# Patient Record
Sex: Female | Born: 1965 | Race: White | Hispanic: No | Marital: Married | Smoking: Never smoker
Health system: Southern US, Community
[De-identification: ages and names within clinical notes are randomized; demographics above are authoritative.]

## PROBLEM LIST (undated history)

## (undated) DIAGNOSIS — J189 Pneumonia, unspecified organism: Secondary | ICD-10-CM

## (undated) DIAGNOSIS — E785 Hyperlipidemia, unspecified: Secondary | ICD-10-CM

## (undated) DIAGNOSIS — D801 Nonfamilial hypogammaglobulinemia: Secondary | ICD-10-CM

## (undated) DIAGNOSIS — C50919 Malignant neoplasm of unspecified site of unspecified female breast: Secondary | ICD-10-CM

## (undated) DIAGNOSIS — T7840XA Allergy, unspecified, initial encounter: Secondary | ICD-10-CM

## (undated) DIAGNOSIS — Z9221 Personal history of antineoplastic chemotherapy: Secondary | ICD-10-CM

## (undated) DIAGNOSIS — E282 Polycystic ovarian syndrome: Secondary | ICD-10-CM

## (undated) HISTORY — PX: PARTIAL HYSTERECTOMY: SHX80

## (undated) HISTORY — PX: TONSILLECTOMY: SUR1361

## (undated) HISTORY — PX: LASER ABLATION CONDYLOMA CERVICAL / VULVAR: SUR819

## (undated) HISTORY — DX: Malignant neoplasm of unspecified site of unspecified female breast: C50.919

## (undated) HISTORY — DX: Nonfamilial hypogammaglobulinemia: D80.1

## (undated) HISTORY — DX: Allergy, unspecified, initial encounter: T78.40XA

## (undated) HISTORY — PX: PORTACATH PLACEMENT: SHX2246

## (undated) HISTORY — DX: Polycystic ovarian syndrome: E28.2

## (undated) HISTORY — PX: OTHER SURGICAL HISTORY: SHX169

## (undated) HISTORY — DX: Personal history of antineoplastic chemotherapy: Z92.21

## (undated) HISTORY — PX: COLONOSCOPY: SHX174

## (undated) HISTORY — DX: Hyperlipidemia, unspecified: E78.5

## (undated) HISTORY — DX: Pneumonia, unspecified organism: J18.9

## (undated) HISTORY — PX: BREAST RECONSTRUCTION: SHX9

## (undated) HISTORY — PX: BREAST LUMPECTOMY: SHX2

---

## 2005-07-29 HISTORY — PX: MASTECTOMY: SHX3

## 2005-09-23 ENCOUNTER — Encounter: Admission: RE | Admit: 2005-09-23 | Discharge: 2005-09-23 | Payer: Self-pay | Admitting: Surgery

## 2010-08-19 ENCOUNTER — Encounter: Payer: Self-pay | Admitting: Surgery

## 2011-12-02 ENCOUNTER — Other Ambulatory Visit: Payer: Self-pay | Admitting: Neurosurgery

## 2011-12-02 DIAGNOSIS — M25552 Pain in left hip: Secondary | ICD-10-CM

## 2011-12-04 ENCOUNTER — Ambulatory Visit
Admission: RE | Admit: 2011-12-04 | Discharge: 2011-12-04 | Disposition: A | Discharge status: Home / Self Care | Payer: Self-pay | Source: Ambulatory Visit | Attending: Neurosurgery | Admitting: Neurosurgery

## 2011-12-04 DIAGNOSIS — M25552 Pain in left hip: Secondary | ICD-10-CM

## 2011-12-06 ENCOUNTER — Other Ambulatory Visit: Payer: Self-pay

## 2012-09-04 ENCOUNTER — Ambulatory Visit (INDEPENDENT_AMBULATORY_CARE_PROVIDER_SITE_OTHER): Payer: BC Managed Care – PPO | Admitting: Internal Medicine

## 2012-09-04 ENCOUNTER — Ambulatory Visit (HOSPITAL_COMMUNITY)
Admission: RE | Admit: 2012-09-04 | Discharge: 2012-09-04 | Disposition: A | Discharge status: Home / Self Care | Payer: BC Managed Care – PPO | Source: Ambulatory Visit | Attending: Internal Medicine | Admitting: Internal Medicine

## 2012-09-04 ENCOUNTER — Encounter: Payer: Self-pay | Admitting: Internal Medicine

## 2012-09-04 VITALS — BP 110/60 | HR 76 | Ht 64.0 in | Wt 171.5 lb

## 2012-09-04 DIAGNOSIS — R1031 Right lower quadrant pain: Secondary | ICD-10-CM

## 2012-09-04 DIAGNOSIS — R10813 Right lower quadrant abdominal tenderness: Secondary | ICD-10-CM

## 2012-09-04 DIAGNOSIS — K5900 Constipation, unspecified: Secondary | ICD-10-CM

## 2012-09-04 MED ORDER — IBUPROFEN 800 MG PO TABS: 800.0000 mg | ORAL_TABLET | Freq: Four times a day (QID) | ORAL | Status: DC | PRN

## 2012-09-04 NOTE — Progress Notes (Signed)
Quick Note:  Message left re: results Will follow-up by phone 1-2 days ______

## 2012-09-04 NOTE — Patient Instructions (Addendum)
You have been scheduled for a CT scan of the abdomen and pelvis at  [facility] .  You are scheduled today at 5:30pm. You should arrive 15 minutes prior to your appointment time for registration. Please follow the written instructions below on the day of your exam:  WARNING: IF YOU ARE ALLERGIC TO IODINE/X-RAY DYE, PLEASE NOTIFY RADIOLOGY IMMEDIATELY AT [phone]! YOU WILL BE GIVEN A 13 HOUR PREMEDICATION PREP.  1) Do not eat or drink anything after _____ (4 hours prior to your test) 2) You have been given 2 bottles of oral contrast to drink. The solution may taste               better if refrigerated, but do NOT add ice or any other liquid to this solution. Shake             well before drinking.    Drink 1 bottle of contrast @ now (2 hours prior to your exam)  Drink 1 bottle of contrast @ hospital (1 hour prior to your exam)  You may take any medications as prescribed with a small amount of water except for the following: Metformin, Glucophage, Glucovance, Avandamet, Riomet, Fortamet, Actoplus Met, Janumet, Glumetza or Metaglip. The above medications must be held the day of the exam AND 48 hours after the exam.  The purpose of you drinking the oral contrast is to aid in the visualization of your intestinal tract. The contrast solution may cause some diarrhea. Before your exam is started, you will be given a small amount of fluid to drink. Depending on your individual set of symptoms, you may also receive an intravenous injection of x-ray contrast/dye. Plan on being at [facility] for 30 minutes or long, depending on the type of exam you are having performed.  This test typically takes 30-45 minutes to complete.  If you have any questions regarding your exam or if you need to reschedule, you may call the CT department at [phone] between the hours of 8:00 am and 5:00 pm, Monday-Friday.    Dr. Leone Payor is going to call you with results and plans.  We have sent the  following medications to your pharmacy for you to pick up at your convenience: Ibuprofen   Thank you for choosing me and [facility].  Iva Boop, M.D., [facility]  ________________________________________________________________________

## 2012-09-04 NOTE — Progress Notes (Signed)
Subjective:    Patient ID: Sandy Simmons, female    DOB: 06/29/1966, 47 y.o.   MRN: [id number]  HPI This very pleasant woman presents with several days of constant and increasing RLQ abdominal pain and tenderness. She has a history of chronic constipation but had been moving her bowels. She developed a steady RLQ pain and flank/back radiation about 3-4 days ago. It was not affected by defecation or eating or movement. No fever. Hgb and WBC normal at urgent care yesterday. KUB 1 view shows a large amount of stool but no other problems. Images viewed. UA was also negative also. (Lab results per patient). She started cipro and metronidazole yesterday. No fever. She took Aleve x 1 without help. She is not sleeping well due to pain. She is unable to take typical analgesics due to allergies/anaphylaxis. She had a colonoscopy in [place] 2009 - says no problems. Had pain after chemoTx for breast cancer. ?'s if oopherectomy and scar tissue are related to pain. Uterus remains.  Allergies  Allergen Reactions  . Codeine   . Demerol (Meperidine)   . Dilaudid (Hydromorphone Hcl)   . Hydrocodone   . Iohexol     Anaphylaxis even with prednisone prep  . Peanuts (Peanut Oil)   . Percocet (Oxycodone-Acetaminophen)   . Eggs Or Egg-Derived Products     Past Medical History  Diagnosis Date  . Breast cancer     lymph nodes  . HLD (hyperlipidemia)   . Pneumonia   . Polycystic ovaries   . Hypogammaglobulinemia, acquired    Past Surgical History  Procedure Date  . Partial hysterectomy     ovaries and tubes removed  . Tonsillectomy   . Laser ablation condyloma cervical / vulvar   . Portacath placement   . Portacath removal   . Mastectomy     bilateral  . Breast reconstruction   . Breast lumpectomy     lymph nodes removed  . Colonoscopy    History   Social History  . Marital Status: Married    Spouse Name: N/A    Number of Children: 1  . Years of Education: N/A   Occupational History  .  preschool teacher    Social History Main Topics  . Smoking status: Never Smoker   . Smokeless tobacco: Never Used  . Alcohol Use: No  . Drug Use: No    Social History Narrative   Married, 1 daughter - Administrator   Family History  Problem Relation Age of Onset  . Colon cancer Maternal Grandmother   . Colon cancer Maternal Uncle   . Colon cancer Maternal Aunt   . Colon polyps Mother   . Diabetes Father   . Heart disease Father   . Heart disease Other     both sides of family        Review of Systems     Objective:   Physical Exam General:  Well-developed, well-nourished and in no acute distress Eyes:  anicteric. ENT:   Mouth and posterior pharynx free of lesions.  Neck:   supple w/o thyromegaly or mass.  Lungs: Clear to auscultation bilaterally. Heart:  S1S2, no rubs, murmurs, gallops. Abdomen:  soft, tender in RLQ - moderate without guarding or rebound, no hepatosplenomegaly, hernia, or mass and BS+.  Lymph:  no cervical or supraclavicular adenopathy. Extremities:   no edema Skin   no rash. Neuro:  A&O x 3.  Psych:  appropriate mood and  Affec  Assessment & Plan:   1. RLQ abdominal pain  ,CT Abdomen Pelvis Wo Contrast  2. RLQ abdominal tenderness  CT Abdomen Pelvis Wo Contrast  3. Constipation     1. CT shows no acute abnormality 2. Diverticulitis still possible and seems like most likely dx but not clear 3. Stay on cipro/metronidazole 4. Reassess by phone - may need more aggressive laxatives, pelvic US/MR depending upon course  Cc:Dr. Claude Manges

## 2012-09-07 NOTE — Progress Notes (Signed)
Quick Note:  I ended up speaking to her and advised MiraLax purge if not better on 2/8 She was to call us and let us know how she was To complete Abx also ______

## 2012-09-12 ENCOUNTER — Other Ambulatory Visit: Payer: Self-pay

## 2012-09-16 ENCOUNTER — Telehealth: Payer: Self-pay | Admitting: Internal Medicine

## 2012-09-16 NOTE — Telephone Encounter (Signed)
MiraLax 1 -2 doses a day to help constipation  If that is not effective or has failed in past would use Linzess 145 ug daily and could sample or Rx for 1 month 1 refill

## 2012-09-16 NOTE — Telephone Encounter (Signed)
Patient doing better, but still having pain on the right side and constipation.  Please advise

## 2012-09-16 NOTE — Telephone Encounter (Signed)
Left message for patient to call back  

## 2012-09-18 NOTE — Telephone Encounter (Signed)
Left message for patient to call back  

## 2012-09-18 NOTE — Telephone Encounter (Signed)
I will send the patient a message via my chart.   No return calls from the patient prior to the weekend

## 2012-11-17 ENCOUNTER — Telehealth: Payer: Self-pay | Admitting: Internal Medicine

## 2012-11-17 NOTE — Telephone Encounter (Signed)
Jill Alexanders called and the patient needs a colon for a family history of colon cancer and "routine screening".  They did not know who the family member was.  I have left a message for the patient to call back to discuss

## 2012-11-17 NOTE — Telephone Encounter (Signed)
Patient was recently seen by Dr. Leone Payor and no recall was indicated at that time.  I did speak with Jill Alexanders from Dr. Claude Manges, he is not sure what the reason for the colon is.  He will find out and call me back.

## 2012-11-17 NOTE — Telephone Encounter (Signed)
Maternal grandmother, aunt and uncle all had colon cancer.  Her oncologist would like her to have a screening colon now.  She reports she can't do until this summer.  Dr. Leone Payor is it ok to schedule?

## 2012-11-17 NOTE — Telephone Encounter (Signed)
Left message for patient to call back  

## 2012-11-19 NOTE — Telephone Encounter (Signed)
OK to schedule a colonoscopy re: family hx this summer

## 2012-11-19 NOTE — Telephone Encounter (Signed)
I have notified Sandy Simmons at [facility] and left a message for the patient to call back when she is ready to schedule the colon this summer when she is out of school.

## 2013-01-08 ENCOUNTER — Telehealth: Payer: Self-pay | Admitting: Internal Medicine

## 2013-01-08 ENCOUNTER — Encounter: Payer: Self-pay | Admitting: Internal Medicine

## 2013-01-08 NOTE — Telephone Encounter (Signed)
yes

## 2013-01-08 NOTE — Telephone Encounter (Signed)
Dr. Leone Payor she would like to use osmoprep for her procedure is this ok (Dixie's sis-in-law)?

## 2013-01-08 NOTE — Telephone Encounter (Signed)
Left message for patient to call back  

## 2013-01-11 NOTE — Telephone Encounter (Signed)
Patient notified ok for Osmoprep

## 2013-01-20 ENCOUNTER — Encounter: Payer: Self-pay | Admitting: Internal Medicine

## 2013-01-20 ENCOUNTER — Ambulatory Visit (AMBULATORY_SURGERY_CENTER): Payer: BC Managed Care – PPO

## 2013-01-20 VITALS — Ht 65.0 in | Wt 172.6 lb

## 2013-01-20 DIAGNOSIS — Z800 Family history of malignant neoplasm of digestive organs: Secondary | ICD-10-CM

## 2013-01-20 DIAGNOSIS — Z1211 Encounter for screening for malignant neoplasm of colon: Secondary | ICD-10-CM

## 2013-01-20 DIAGNOSIS — R109 Unspecified abdominal pain: Secondary | ICD-10-CM

## 2013-01-20 NOTE — Progress Notes (Signed)
Osmoprep called into [facility] in [place], [place] [phone]).OK per Dr Leone Payor.

## 2013-01-26 ENCOUNTER — Ambulatory Visit (AMBULATORY_SURGERY_CENTER): Payer: BC Managed Care – PPO | Admitting: Internal Medicine

## 2013-01-26 ENCOUNTER — Encounter: Payer: Self-pay | Admitting: Internal Medicine

## 2013-01-26 VITALS — BP 127/76 | HR 82 | Temp 97.7°F | Resp 18 | Ht 65.0 in | Wt 172.0 lb

## 2013-01-26 DIAGNOSIS — Z800 Family history of malignant neoplasm of digestive organs: Secondary | ICD-10-CM

## 2013-01-26 DIAGNOSIS — Z1211 Encounter for screening for malignant neoplasm of colon: Secondary | ICD-10-CM

## 2013-01-26 HISTORY — PX: COLONOSCOPY: SHX174

## 2013-01-26 MED ORDER — SODIUM CHLORIDE 0.9 % IV SOLN: 500.0000 mL | INTRAVENOUS | Status: DC

## 2013-01-26 MED ORDER — LINACLOTIDE 290 MCG PO CAPS: 1.0000 | ORAL_CAPSULE | Freq: Every day | ORAL | Status: DC

## 2013-01-26 NOTE — Progress Notes (Signed)
Lidocaine-40mg IV prior to Propofol InductionPropofol given over incremental dosages 

## 2013-01-26 NOTE — Op Note (Signed)
[facility] [address] [place], [address]   COLONOSCOPY PROCEDURE REPORT  PATIENT: Sandy, Simmons  MR#: [id number] BIRTHDATE: 19-May-1966 , 46  yrs. old GENDER: Female ENDOSCOPIST: Iva Boop, MD, [facility] PROCEDURE DATE:  01/26/2013 PROCEDURE:   Colonoscopy, screening ASA CLASS:   Class II INDICATIONS:Patient's family history of colon cancer, distant relatives.   Maternal grandmother, aunt and uncle MEDICATIONS: propofol (Diprivan) 200mg  IV, MAC sedation, administered by CRNA, and These medications were titrated to patient response per physician's verbal order  DESCRIPTION OF PROCEDURE:   After the risks benefits and alternatives of the procedure were thoroughly explained, informed consent was obtained.  A digital rectal exam revealed no abnormalities of the rectum.   The LB [id number]  endoscope was introduced through the anus and advanced to the terminal ileum which was intubated for a short distance. No adverse events experienced.   The quality of the prep was good, using Osmoprep The instrument was then slowly withdrawn as the colon was fully examined.      COLON FINDINGS: A normal appearing cecum, ileocecal valve, and appendiceal orifice were identified.  The ascending, hepatic flexure, transverse, splenic flexure, descending, sigmoid colon and rectum appeared unremarkable.  No polyps or cancers were seen.   A right colon retroflexion was performed. Terminal ileum normal. Retroflexed views revealed no abnormalities. The time to cecum=2 minutes 48 seconds.  Withdrawal time=10 minutes 12 seconds.  The scope was withdrawn and the procedure completed. COMPLICATIONS: There were no complications.  ENDOSCOPIC IMPRESSION: Normal colonoscopy and terminal ileoscopy, good prep  RECOMMENDATIONS: 1.  Try Linzess 290 mcg daily before breakfast for chronic constipation 2.  Repeat Colonoscopy in 5 years.   eSigned:  Iva Boop, MD, [facility]  01/26/2013 4:22 PM   cc: The Patient, Cathi Roan, MD [facility]

## 2013-01-26 NOTE — Patient Instructions (Addendum)
The colonoscopy was normal! Next routine colonoscopy in 5 years.  Try the Linzess for constipation. I am going to provide some samples and a prescription. Its on your med list. You may not need Miralax with the Linzess.    I appreciate the opportunity to care for you. Iva Boop, MD, FACG  YOU HAD AN ENDOSCOPIC PROCEDURE TODAY AT THE [facility]: Refer to the procedure report that was given to you for any specific questions about what was found during the examination.  If the procedure report does not answer your questions, please call your gastroenterologist to clarify.  If you requested that your care partner not be given the details of your procedure findings, then the procedure report has been included in a sealed envelope for you to review at your convenience later.  YOU SHOULD EXPECT: Some feelings of bloating in the abdomen. Passage of more gas than usual.  Walking can help get rid of the air that was put into your GI tract during the procedure and reduce the bloating. If you had a lower endoscopy (such as a colonoscopy or flexible sigmoidoscopy) you may notice spotting of blood in your stool or on the toilet paper. If you underwent a bowel prep for your procedure, then you may not have a normal bowel movement for a few days.  DIET: Your first meal following the procedure should be a light meal and then it is ok to progress to your normal diet.  A half-sandwich or bowl of soup is an example of a good first meal.  Heavy or fried foods are harder to digest and may make you feel nauseous or bloated.  Likewise meals heavy in dairy and vegetables can cause extra gas to form and this can also increase the bloating.  Drink plenty of fluids but you should avoid alcoholic beverages for 24 hours.  ACTIVITY: Your care partner should take you home directly after the procedure.  You should plan to take it easy, moving slowly for the rest of the day.  You can resume normal activity the  day after the procedure however you should NOT DRIVE or use heavy machinery for 24 hours (because of the sedation medicines used during the test).    SYMPTOMS TO REPORT IMMEDIATELY: A gastroenterologist can be reached at any hour.  During normal business hours, 8:30 AM to 5:00 PM Monday through Friday, call [phone].  After hours and on weekends, please call the GI answering service at [phone] emergency number  who will take a message and have the physician on call contact you.   Following lower endoscopy (colonoscopy or flexible sigmoidoscopy):  Excessive amounts of blood in the stool  Significant tenderness or worsening of abdominal pains  Swelling of the abdomen that is new, acute  Fever of 100F or higher  FOLLOW UP: If any biopsies were taken you will be contacted by phone or by letter within the next 1-3 weeks.  Call your gastroenterologist if you have not heard about the biopsies in 3 weeks.  Our staff will call the home number listed on your records the next business day following your procedure to check on you and address any questions or concerns that you may have at that time regarding the information given to you following your procedure. This is a courtesy call and so if there is no answer at the home number and we have not heard from you through the emergency physician on call, we will assume that you  have returned to your regular daily activities without incident.  SIGNATURES/CONFIDENTIALITY: You and/or your care partner have signed paperwork which will be entered into your electronic medical record.  These signatures attest to the fact that that the information above on your After Visit Summary has been reviewed and is understood.  Full responsibility of the confidentiality of this discharge information lies with you and/or your care-partner.  Repeat colonoscopy in 5 years

## 2013-01-26 NOTE — Progress Notes (Signed)
Pre op eval patient states allergy to egg yoke creates headaches.No problems with egg whites

## 2013-01-26 NOTE — Progress Notes (Signed)
Patient did not experience any of the following events: a burn prior to discharge; a fall within the facility; wrong site/side/patient/procedure/implant event; or a hospital transfer or hospital admission upon discharge from the facility. (G8907)Patient did not have preoperative order for IV antibiotic SSI prophylaxis. ([id number]) ewm 

## 2013-01-27 ENCOUNTER — Telehealth: Payer: Self-pay | Admitting: Internal Medicine

## 2013-01-27 ENCOUNTER — Telehealth: Payer: Self-pay | Admitting: *Deleted

## 2013-01-27 NOTE — Telephone Encounter (Signed)
  Follow up Call-  Call back number 01/26/2013  Post procedure Call Back phone  # [phone]  Permission to leave phone message Yes     Patient questions:  Do you have a fever, pain , or abdominal swelling? no Pain Score  0 *  Have you tolerated food without any problems? yes  Have you been able to return to your normal activities? yes  Do you have any questions about your discharge instructions: Diet   no Medications  no Follow up visit  no  Do you have questions or concerns about your Care? no  Actions: * If pain score is 4 or above: No action needed, pain <4.

## 2013-01-27 NOTE — Telephone Encounter (Signed)
Unfortunately, all of our copay cards for Linzess have expired as of yesterday. We have, however, contacted Sandy Simmons, the Linzess drug rep for additional copay cards. Patient advised that we will contact her to let her know when we receive updated cards and we will mail her one of them. She verbalizes understanding.

## 2013-01-28 ENCOUNTER — Telehealth: Payer: Self-pay

## 2013-01-28 NOTE — Telephone Encounter (Signed)
Spoke to patient regarding the Linzess drug card.  Our Reps have assured [place] that they are good despite the expiration date of 01/25/13.  That has been extended.  I am going to mail her out another card and I told her the Rep said the pharmacy has to run the card numbers to get it to go thru.  She will call back if any problems.

## 2013-01-29 ENCOUNTER — Encounter (HOSPITAL_COMMUNITY): Payer: Self-pay | Admitting: *Deleted

## 2013-01-29 ENCOUNTER — Emergency Department (HOSPITAL_COMMUNITY)
Admission: EM | Admit: 2013-01-29 | Discharge: 2013-01-29 | Disposition: A | Discharge status: Home / Self Care | Payer: BC Managed Care – PPO | Attending: Emergency Medicine | Admitting: Emergency Medicine

## 2013-01-29 DIAGNOSIS — Z8742 Personal history of other diseases of the female genital tract: Secondary | ICD-10-CM | POA: Insufficient documentation

## 2013-01-29 DIAGNOSIS — Z79899 Other long term (current) drug therapy: Secondary | ICD-10-CM | POA: Insufficient documentation

## 2013-01-29 DIAGNOSIS — R319 Hematuria, unspecified: Secondary | ICD-10-CM | POA: Insufficient documentation

## 2013-01-29 DIAGNOSIS — E785 Hyperlipidemia, unspecified: Secondary | ICD-10-CM | POA: Insufficient documentation

## 2013-01-29 DIAGNOSIS — R110 Nausea: Secondary | ICD-10-CM | POA: Insufficient documentation

## 2013-01-29 DIAGNOSIS — Z8701 Personal history of pneumonia (recurrent): Secondary | ICD-10-CM | POA: Insufficient documentation

## 2013-01-29 DIAGNOSIS — Z853 Personal history of malignant neoplasm of breast: Secondary | ICD-10-CM | POA: Insufficient documentation

## 2013-01-29 DIAGNOSIS — R197 Diarrhea, unspecified: Secondary | ICD-10-CM | POA: Insufficient documentation

## 2013-01-29 DIAGNOSIS — Z3202 Encounter for pregnancy test, result negative: Secondary | ICD-10-CM | POA: Insufficient documentation

## 2013-01-29 DIAGNOSIS — R109 Unspecified abdominal pain: Secondary | ICD-10-CM | POA: Insufficient documentation

## 2013-01-29 DIAGNOSIS — R350 Frequency of micturition: Secondary | ICD-10-CM | POA: Insufficient documentation

## 2013-01-29 LAB — CBC WITH DIFFERENTIAL/PLATELET
Hemoglobin: 13.4 g/dL (ref 12.0–15.0)
Lymphocytes Relative: 27 % (ref 12–46)
Lymphs Abs: 2.6 10*3/uL (ref 0.7–4.0)
MCH: 28.8 pg (ref 26.0–34.0)
MCHC: 33.6 g/dL (ref 30.0–36.0)
Neutro Abs: 6.1 10*3/uL (ref 1.7–7.7)
Neutrophils Relative %: 63 % (ref 43–77)
Platelets: 232 10*3/uL (ref 150–400)
RDW: 13 % (ref 11.5–15.5)

## 2013-01-29 LAB — BASIC METABOLIC PANEL
BUN: 12 mg/dL (ref 6–23)
CO2: 27 mEq/L (ref 19–32)
Chloride: 104 mEq/L (ref 96–112)
GFR calc non Af Amer: >90 mL/min (ref >90)
Glucose, Bld: 104 mg/dL — ABNORMAL HIGH (ref 70–99)
Potassium: 3.7 mEq/L (ref 3.5–5.1)

## 2013-01-29 LAB — URINALYSIS, ROUTINE W REFLEX MICROSCOPIC
Bilirubin Urine: NEGATIVE
Glucose, UA: NEGATIVE mg/dL
Hgb urine dipstick: NEGATIVE
Leukocytes, UA: NEGATIVE
Nitrite: NEGATIVE
Urobilinogen, UA: 0.2 mg/dL (ref 0.0–1.0)

## 2013-01-29 LAB — PREGNANCY, URINE: Preg Test, Ur: NEGATIVE

## 2013-01-29 MED ORDER — ONDANSETRON HCL 4 MG PO TABS: 4.0000 mg | ORAL_TABLET | Freq: Four times a day (QID) | ORAL | Status: DC

## 2013-01-29 MED ORDER — NAPROXEN 500 MG PO TABS
500.0000 mg | ORAL_TABLET | Freq: Once | ORAL | Status: AC
  Administered 2013-01-29: 500 mg via ORAL
  Filled 2013-01-29: qty 1

## 2013-01-29 MED ORDER — NAPROXEN 500 MG PO TABS: 500.0000 mg | ORAL_TABLET | Freq: Two times a day (BID) | ORAL | Status: AC

## 2013-01-29 NOTE — ED Provider Notes (Signed)
History    CSN: [id number] Arrival date & time 01/29/13  0157  First MD Initiated Contact with Patient 01/29/13 [phone]     Chief Complaint  Patient presents with  . Abdominal Pain  . Nausea  . Urinary Frequency   (Consider location/radiation/quality/duration/timing/severity/associated sxs/prior Treatment) The history is provided by the patient. No language interpreter was used.  Sandy Simmons is a 47 y/o F with PMHx of breast cancer with double mastectomy with reconstruction and chemotherapy completed in 2007, HLD, polycystic ovaries, partial hysterectomy, hypogammaglobulinemia presenting to the ED with abdominal pain that occurred last night that came on suddenly, lasting for approximately an hour and a half while patient was at the restaurant. Stated that abdominal was localized to the right lower quadrant, suprapubic region - radiation from the right flank region that was described as a pain that made her double-over. Patient stated that with this onset of pain she felt extremely nausea, broke out in a cold sweat. Stated that when she went to the bathroom she has hematuria and started to drink plenty of water. Stated that when she went to the bathroom again her urine was a pinkish color - stated that she urinated approximately 6 times yesterday, the first being hematuria and the remaining urine had mild hematuria to none. Stated that she has a burning sensation to the right flank. Denied chest pain, shortness of breath, difficulty breathing, numbness, tingling, fever, chills, vaginal bleeding, vaginal pain, vomiting, vaginal discharge. Reported that she has history of kidney stones when she was younger.   Oncologist: Dr. Hardie Pulley    Past Medical History  Diagnosis Date  . Breast cancer     lymph nodes /double mastectomy  . HLD (hyperlipidemia)   . Pneumonia   . Polycystic ovaries   . Hypogammaglobulinemia, acquired   . History of cancer chemotherapy     In 2007/  for 7 months    Past Surgical History  Procedure Laterality Date  . Partial hysterectomy      ovaries and tubes removed  . Tonsillectomy    . Laser ablation condyloma cervical / vulvar    . Portacath placement    . Portacath removal    . Mastectomy  2007    bilateral  . Breast reconstruction  2009-2010  . Breast lumpectomy      lymph nodes removed  . Colonoscopy     Family History  Problem Relation Age of Onset  . Colon cancer Maternal Grandmother   . Colon cancer Maternal Uncle   . Colon cancer Maternal Aunt   . Colon polyps Mother   . Diabetes Father   . Heart disease Father   . Heart disease Other     both sides of family   History  Substance Use Topics  . Smoking status: Never Smoker   . Smokeless tobacco: Never Used  . Alcohol Use: No   OB History   Grav Para Term Preterm Abortions TAB SAB Ect Mult Living                 Review of Systems  Constitutional: Negative for fever and chills.  HENT: Negative for neck pain.   Respiratory: Negative for chest tightness and shortness of breath.   Cardiovascular: Negative for chest pain.  Gastrointestinal: Positive for nausea, abdominal pain and diarrhea. Negative for vomiting, constipation, blood in stool and anal bleeding.  Genitourinary: Positive for hematuria and flank pain (right sided). Negative for vaginal bleeding, vaginal discharge, difficulty urinating and vaginal  pain.  Neurological: Negative for dizziness, weakness and headaches.  All other systems reviewed and are negative.    Allergies  Codeine; Darvocet; Demerol; Dilaudid; Hydrocodone; Iohexol; Lortab; Morphine and related; Peanuts; Percocet; Aspartame and phenylalanine; Eggs or egg-derived products; Erythromycin; and Penicillins  Home Medications   Current Outpatient Rx  Name  Route  Sig  Dispense  Refill  . calcium carbonate (OS-CAL) 600 MG TABS   Oral   Take 1,200 mg by mouth daily with breakfast.          . ibuprofen (ADVIL,MOTRIN) 200 MG tablet   Oral    Take 800 mg by mouth every 6 (six) hours as needed for pain.         Marland Kitchen FeFum-FePoly-FA-B Cmp-C-Biot (INTEGRA PLUS) CAPS   Oral   Take 1 capsule by mouth daily.         . Linaclotide (LINZESS) 290 MCG CAPS   Oral   Take 1 capsule by mouth daily before breakfast.   30 capsule   11   . naproxen (NAPROSYN) 500 MG tablet   Oral   Take 1 tablet (500 mg total) by mouth 2 (two) times daily.   30 tablet   0   . ondansetron (ZOFRAN) 4 MG tablet   Oral   Take 1 tablet (4 mg total) by mouth every 6 (six) hours.   12 tablet   0   . Vitamin D, Ergocalciferol, (DRISDOL) 50000 UNITS CAPS   Oral   Take 50,000 Units by mouth every 7 (seven) days. saturday          BP 117/65  Pulse 62  Temp(Src) 97.8 F (36.6 C) (Oral)  Resp 16  Ht 5\' 5"  (1.651 m)  Wt 164 lb (74.39 kg)  BMI 27.29 kg/m2  SpO2 98% Physical Exam  Nursing note and vitals reviewed. Constitutional: She is oriented to person, place, and time. She appears well-developed and well-nourished. No distress.  HENT:  Head: Normocephalic and atraumatic.  Eyes: Conjunctivae and EOM are normal. Pupils are equal, round, and reactive to light. Right eye exhibits no discharge. Left eye exhibits no discharge.  Neck: Normal range of motion. Neck supple.  Cardiovascular: Normal rate, regular rhythm and normal heart sounds.  Exam reveals no friction rub.   No murmur heard. Pulses:      Radial pulses are 2+ on the right side, and 2+ on the left side.       Dorsalis pedis pulses are 2+ on the right side, and 2+ on the left side.  Pulmonary/Chest: Effort normal and breath sounds normal. No respiratory distress. She has no wheezes. She has no rales.  Abdominal: Soft. Bowel sounds are normal. She exhibits no distension. There is tenderness in the right lower quadrant and suprapubic area. There is CVA tenderness (right sided - mild). There is no rigidity, no guarding and negative Murphy's sign.    Genitourinary: Vagina normal. No vaginal  discharge found.  Speculum: Negative lesions, sores, masses, inflammation, erythema noted to the external genitalia. Negative blood in vaginal vault. Negative lesions, sores, masses, swelling, erythema noted to the vaginal canal.   Pelvic: Negative blood on glove. Negative masses, lesions, sores noted to vaginal canal. Negative CMT. Negative adnexal tenderness.   Lymphadenopathy:    She has no cervical adenopathy.  Neurological: She is alert and oriented to person, place, and time. No cranial nerve deficit. She exhibits normal muscle tone. Coordination normal.  Skin: Skin is warm and dry. No rash noted. She is not  diaphoretic. No erythema.  Psychiatric: She has a normal mood and affect. Her behavior is normal. Thought content normal.    ED Course  Procedures (including critical care time) Labs Reviewed  BASIC METABOLIC PANEL - Abnormal; Notable for the following:    Glucose, Bld 104 (*)    All other components within normal limits  URINALYSIS, ROUTINE W REFLEX MICROSCOPIC  CBC WITH DIFFERENTIAL  PREGNANCY, URINE   No results found. 1. Flank pain   2. HLD (hyperlipidemia)   3. History of breast cancer     MDM  Patient presenting to the ED with right sided flank pain with radiation to the right lower abdomen that came on suddenly yesterday while at the restaurant with episodes of hematuria. Stating that right flank pain with a burning sensation.  CMP negative findings. CBC negative findings. UA negative findings - no hematuria or infection noted. Discussed case with Dr. Lourena Simmonds at 8:14AM - did not recommend [place] at this time due to no sign of hematuria or infection at this moment found in urine. CT Abdomen and Pelvis performed in 08/2012 - negative stones found in kidneys, kidneys appeared normal - negative findings on CT scan. Recommended pelvic to be performed and if all normal for patient to be discharged. Negative blood in the vaginal vault noted - normal pelvic exam. Patient appears  comfortable. Patient stable, afebrile. Discussed case with Dr. Lourena Simmonds - cleared patient for discharge. Suspicion to be possible passage of kidney stone, definitive etiology unknown. Patient continues to follow Dr. Leone Payor for abdominal pain that has been ongoing since February of this year. Discharged patient with anti-inflammatories and anti-emetics. Referred patient to urology, PCP, and Dr. Leone Payor. Discussed with patient to continue to stay hydrated. Discussed with patient to rest. Discussed with patient to monitor symptoms and if symptoms are to worsen or change to report back to the ED - strict return instructions given.  Patient agreed to plan of care, understood, all questions answered.    Raymon Mutton, PA-C 01/29/13 1656

## 2013-01-29 NOTE — ED Notes (Signed)
Assisted PA with vaginal exam. No specimens collected. Pt tolerated well.

## 2013-01-29 NOTE — ED Notes (Signed)
Pt pressed call light and told secretary that she took some Advil from her purse because she was hurting. PA [place] made aware.

## 2013-01-29 NOTE — ED Notes (Signed)
Pt states she had a colonoscopy done two days ago and tonight severe abdominal cramping and she used the bathroom and her urine had bright red blood and diarrhea,  Pain is still present,  "just don't feel good"

## 2013-01-29 NOTE — ED Provider Notes (Signed)
Medical screening examination/treatment/procedure(s) were performed by non-physician practitioner and as supervising physician I was immediately available for consultation/collaboration.   Quinesha Selinger L Hadlea Furuya, MD 01/29/13 2229 

## 2013-02-02 ENCOUNTER — Telehealth: Payer: Self-pay

## 2013-02-02 NOTE — Telephone Encounter (Signed)
Patient was thought to have had a kidney stone at the ER on 01/28/13.  She has not tried linzess yet.  She will call back if she has continued pain after trying Linzess.

## 2013-02-02 NOTE — Telephone Encounter (Signed)
Message copied by Annett Fabian on Tue Feb 02, 2013  1:42 PM ------      Message from: Stan Head E      Created: Mon Feb 01, 2013 12:21 PM      Regarding: follow-up after ED visit       She went to ED w/ RLQ pain  D days after colonoscopy            The RLQ pain is not new            She is trying Linzess            Let's call her this week and see how she is and I would offer a non-urgent REV after she has tried Linzess ------

## 2013-02-04 ENCOUNTER — Encounter: Payer: BC Managed Care – PPO | Admitting: Internal Medicine

## 2013-05-31 ENCOUNTER — Other Ambulatory Visit: Payer: Self-pay | Admitting: Neurosurgery

## 2013-05-31 DIAGNOSIS — M5416 Radiculopathy, lumbar region: Secondary | ICD-10-CM

## 2013-06-03 ENCOUNTER — Other Ambulatory Visit: Payer: Self-pay

## 2013-06-10 ENCOUNTER — Ambulatory Visit
Admission: RE | Admit: 2013-06-10 | Discharge: 2013-06-10 | Disposition: A | Discharge status: Home / Self Care | Payer: PRIVATE HEALTH INSURANCE | Source: Ambulatory Visit | Attending: Neurosurgery | Admitting: Neurosurgery

## 2013-06-10 DIAGNOSIS — M5416 Radiculopathy, lumbar region: Secondary | ICD-10-CM

## 2013-07-16 IMAGING — CT CT HIP*L* W/O CM
3 of 4 series · 15 of 46 positions shown, 20 images · non-contrast
Comparison: None.

CLINICAL DATA: Left hip pain.

CT OF THE LEFT HIP WITHOUT CONTRAST
TECHNIQUE: Multidetector CT imaging was performed according to the
standard protocol. Multiplanar CT image reconstructions were also
generated.

[Series 2: hip bone · axial · 0.35mm/px · z∈[-336,-166]mm · 11 of 80 slices shown, 16 images]
[im 6/80  soft-tissue]
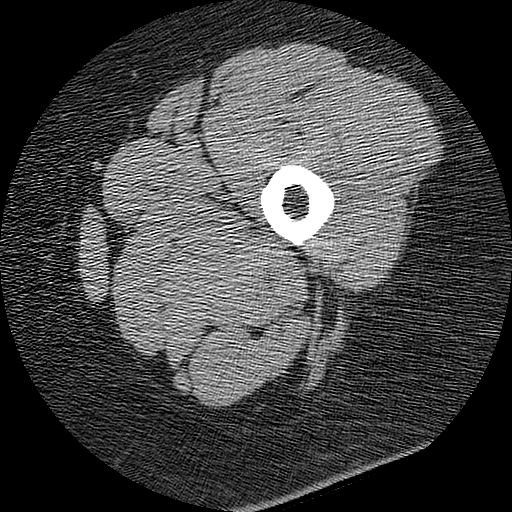
[im 6/80  bone]
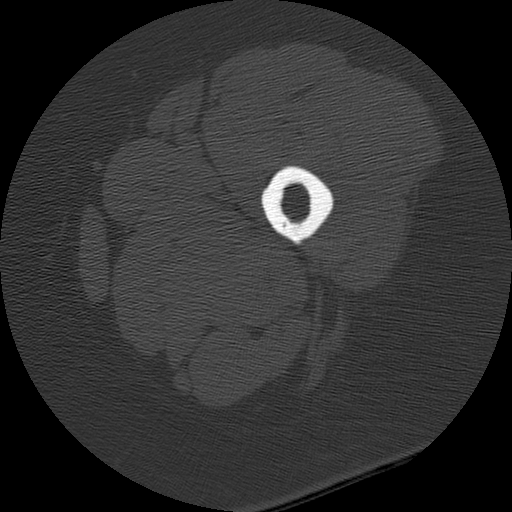
[im 16/80  soft-tissue]
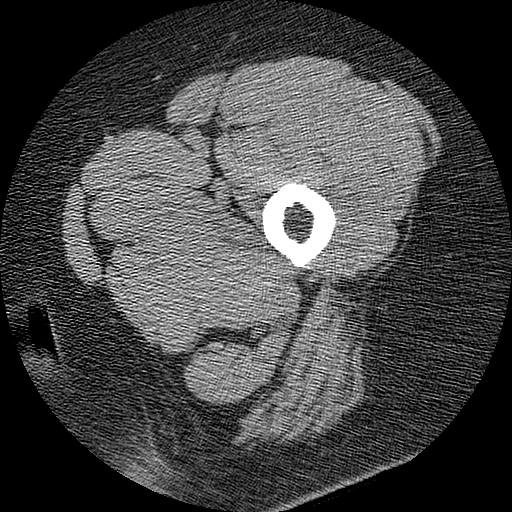
[im 22/80  soft-tissue]
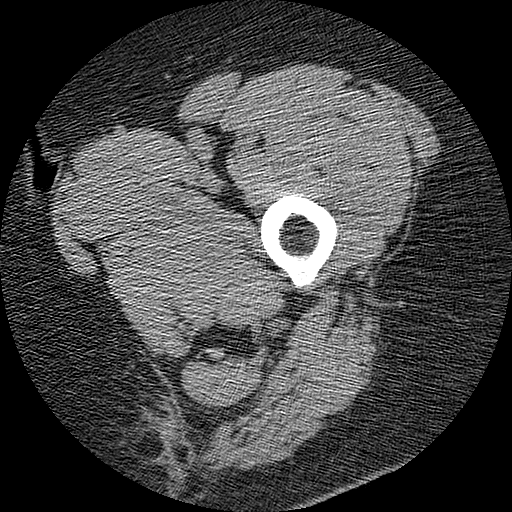
[im 27/80  soft-tissue]
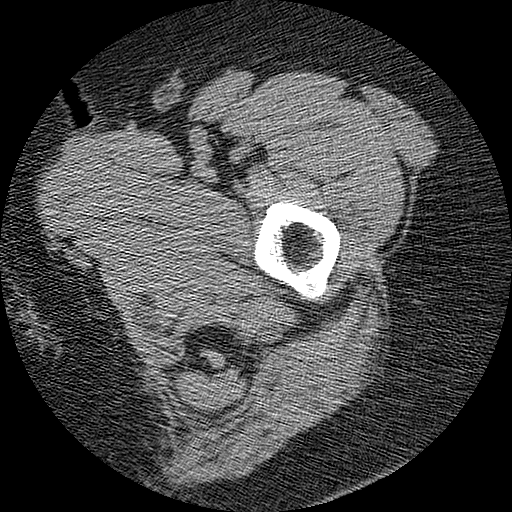
[im 37/80  soft-tissue]
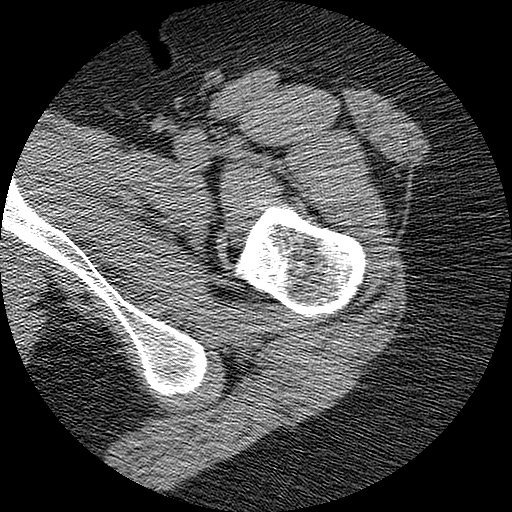
[im 43/80  soft-tissue]
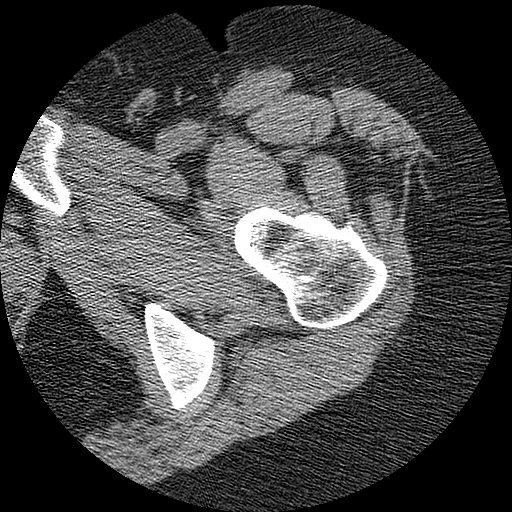
[im 53/80  soft-tissue]
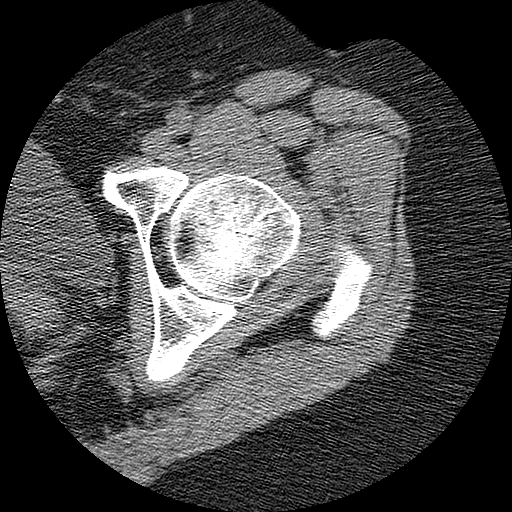
[im 58/80  soft-tissue]
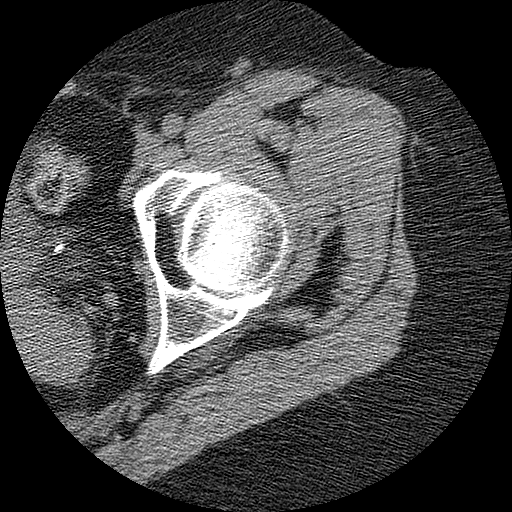
[im 58/80  lung]
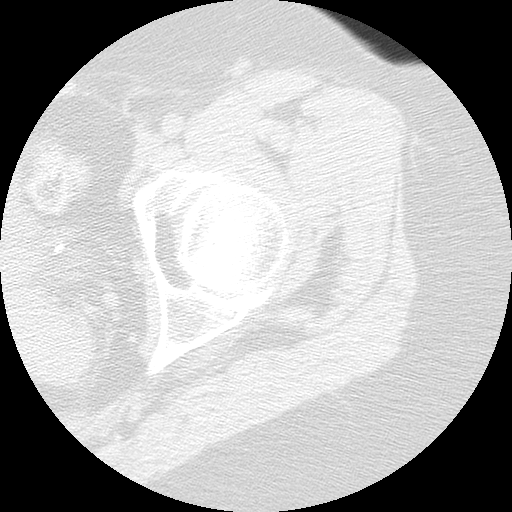
[im 64/80  soft-tissue]
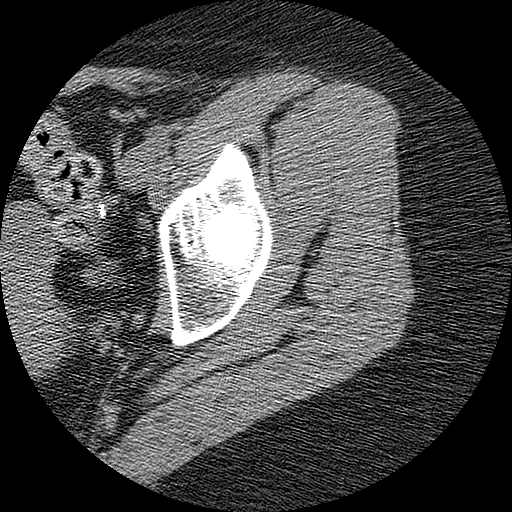
[im 64/80  lung]
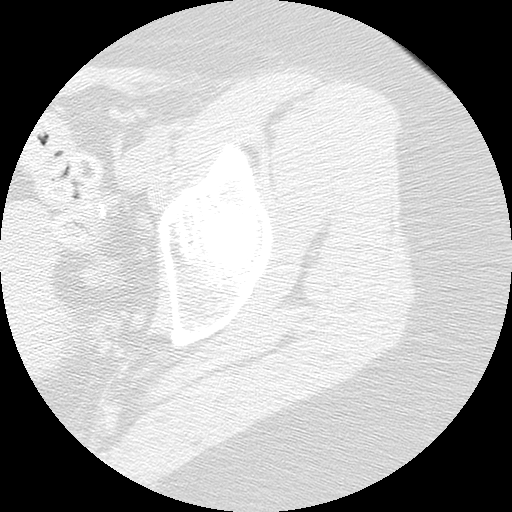
[im 64/80  bone]
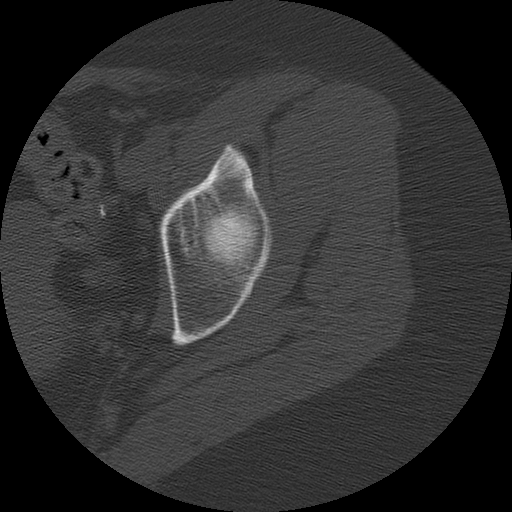
[im 69/80  lung]
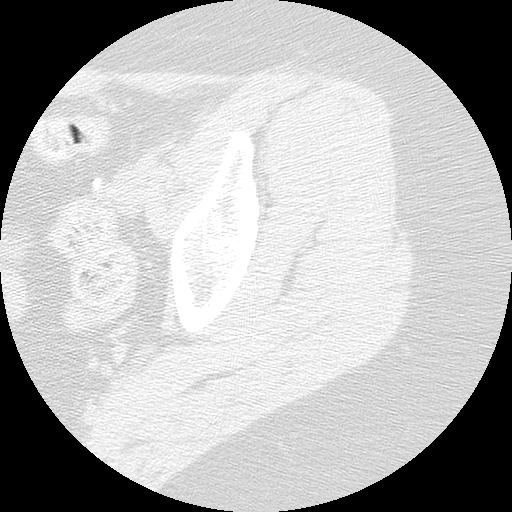
[im 74/80  soft-tissue]
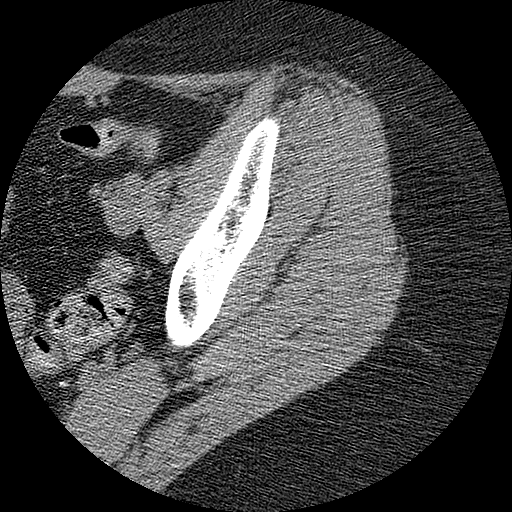
[im 74/80  lung]
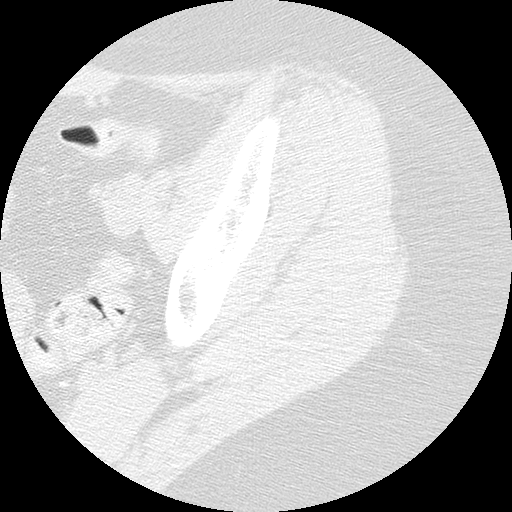

[Series 400: sag · sagittal · 0.39mm/px · 1 of 67 slices shown]
[im 23/67  soft-tissue]
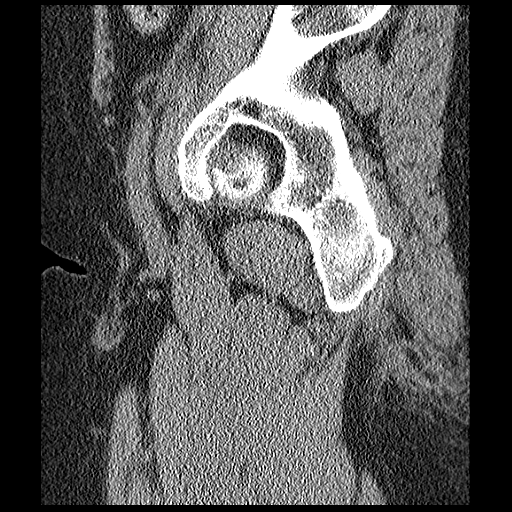

[Series 401: cor · coronal · 0.39mm/px · 3 of 67 slices shown]
[im 23/67  soft-tissue]
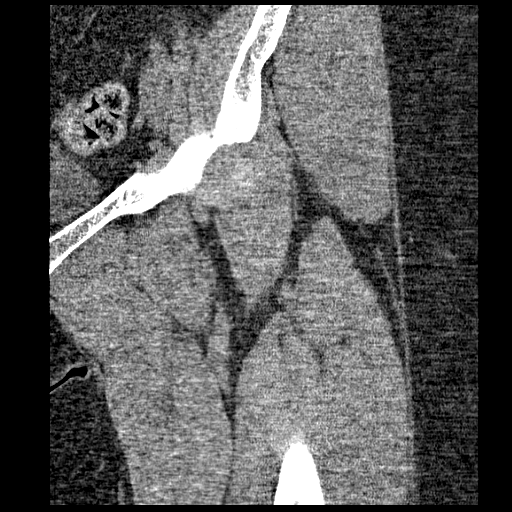
[im 30/67  soft-tissue]
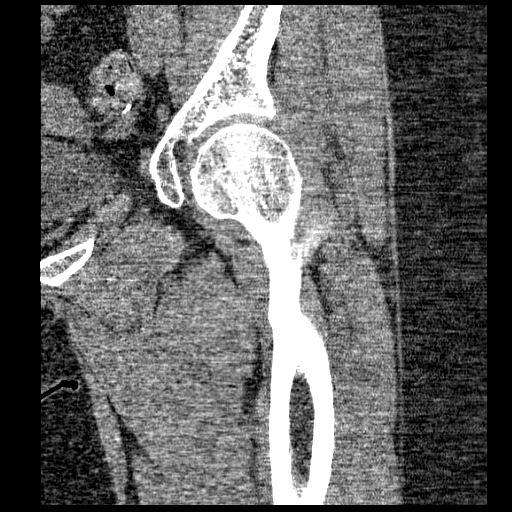
[im 37/67  soft-tissue]
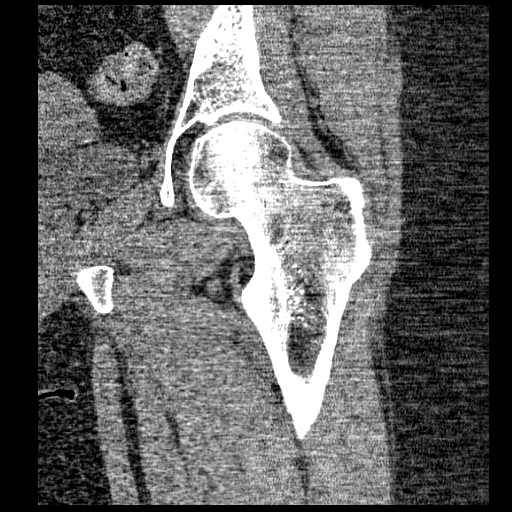

[15 of 46 positions shown; findings below may reference images not displayed]

FINDINGS: The left hip is located.  No evidence of avascular
necrosis is identified.  Joint space appears preserved.  No joint
effusion is identified.  There is no fracture.  Musculature about
the left hip appears normal.  No focal fluid collection or mass is
identified.  Visualized intrapelvic contents are unremarkable.
IMPRESSION: Negative examination.  No finding to explain the patient's
symptoms.

## 2014-01-17 ENCOUNTER — Telehealth: Payer: Self-pay | Admitting: Internal Medicine

## 2014-01-17 NOTE — Telephone Encounter (Signed)
Please advise how many refills for this patient Sandy Simmons, thank you.

## 2014-01-18 MED ORDER — LINACLOTIDE 290 MCG PO CAPS: 290.0000 ug | ORAL_CAPSULE | Freq: Every day | ORAL | Status: DC

## 2014-01-18 NOTE — Telephone Encounter (Signed)
OK to refill x 3 She needs to get further from PCP or come back to [place] (APP visit is fine)

## 2014-01-18 NOTE — Telephone Encounter (Signed)
I spoke to Wolverine and she was under the understanding that you would refill her linzess until she needed another colonoscopy unless she was having problems.  She reminded me that she is Sandy Simmons's sister in [employer].

## 2014-01-18 NOTE — Telephone Encounter (Signed)
Left detailed message for patient and will mail her the [place] discount card tomorrow.  Linzess refilled as requested.

## 2014-01-18 NOTE — Telephone Encounter (Signed)
Sure we will but at some point will need a visit  if she wants [place] to keep refilling it, that's all - could stretch it to late 2015/early 2016 if she needs (6 RF's) May be easier for another doctor she sees regularly to refill it was what I meant

## 2015-01-31 ENCOUNTER — Ambulatory Visit: Payer: PRIVATE HEALTH INSURANCE | Admitting: Urology

## 2015-02-13 ENCOUNTER — Telehealth: Payer: Self-pay | Admitting: Internal Medicine

## 2015-02-13 NOTE — Telephone Encounter (Signed)
Spoke with patient, she is requesting appointment to look at a hemorrhoid that she reports being very swollen and blue in color, very painful. Appointment made with Cecille Rubin Hvozdovic PA-C for tomorrow 02/14/15 at 2:15pm.

## 2015-02-14 ENCOUNTER — Ambulatory Visit (INDEPENDENT_AMBULATORY_CARE_PROVIDER_SITE_OTHER): Payer: PRIVATE HEALTH INSURANCE | Admitting: Physician Assistant

## 2015-02-14 ENCOUNTER — Encounter: Payer: Self-pay | Admitting: Physician Assistant

## 2015-02-14 VITALS — BP 118/74 | HR 68 | Ht 65.0 in | Wt 173.0 lb

## 2015-02-14 DIAGNOSIS — K645 Perianal venous thrombosis: Secondary | ICD-10-CM | POA: Diagnosis not present

## 2015-02-14 MED ORDER — LIDOCAINE (ANORECTAL) 5 % EX CREA: TOPICAL_CREAM | CUTANEOUS | Status: DC

## 2015-02-14 MED ORDER — LINACLOTIDE 290 MCG PO CAPS: 290.0000 ug | ORAL_CAPSULE | Freq: Every day | ORAL | Status: DC

## 2015-02-14 NOTE — Progress Notes (Signed)
Patient ID: Sandy Simmons, female   DOB: 04/27/1966, 49 y.o.   MRN: [id number]     History of Present Illness: Sandy Simmons is a delightful 49 year old female known to Dr. Carlean Purl with a prior history of constipation. She had a colonoscopy on 01/26/2013 which was a normal colonoscopy and terminal ileoscopy. She was started on Linaclotide 290 g daily. She was advised to have a surveillance colonoscopy in 5 years. She has done very nicely with an linaclotide and has been able to use it every second or third day. About 2 weeks ago she went on vacation to [facility] and on the first night ate crab legs. Shortly there after she developed explosive diarrhea for 3 days and so she discontinued her Lynn neck type. She has a long-standing history of a hemorrhoid that typically doesn't bother her, but through the time of her diarrhea became inflamed. It became swollen and extremely tender. She was seen by her primary care physician and prescribed Anusol suppositories and cream which she says she has been using for 2 weeks. She reports that she has not had any relief of her discomfort and it hurts whenever she sits and when she walks. She does not have a tearing sensation when she moves her bowels but says it is very painful for her to move her bowels.   Past Medical History  Diagnosis Date  . Breast cancer     lymph nodes /double mastectomy  . HLD (hyperlipidemia)   . Pneumonia   . Polycystic ovaries   . Hypogammaglobulinemia, acquired   . History of cancer chemotherapy     In 2007/  for 7 months    Past Surgical History  Procedure Laterality Date  . Partial hysterectomy      ovaries and tubes removed  . Tonsillectomy    . Laser ablation condyloma cervical / vulvar    . Portacath placement    . Portacath removal    . Mastectomy  2007    bilateral  . Breast reconstruction  2009-2010  . Breast lumpectomy      lymph nodes removed  . Colonoscopy     Family History  Problem Relation Age of  Onset  . Colon cancer Maternal Grandmother   . Colon cancer Maternal Uncle   . Colon cancer Maternal Aunt   . Colon polyps Mother   . Diabetes Father   . Heart disease Father   . Heart disease Other     both sides of family   History  Substance Use Topics  . Smoking status: Never Smoker   . Smokeless tobacco: Never Used  . Alcohol Use: No   Current Outpatient Prescriptions  Medication Sig Dispense Refill  . ANUCORT-HC 25 MG suppository     . calcium carbonate (OS-CAL) 600 MG TABS Take 1,200 mg by mouth daily with breakfast.     . EPINEPHrine (EPIPEN 2-PAK) 0.3 mg/0.3 mL IJ SOAJ injection Inject into the muscle.    Marland Kitchen FeFum-FePoly-FA-B Cmp-C-Biot (INTEGRA PLUS) CAPS Take 1 capsule by mouth daily.    Marland Kitchen ibuprofen (ADVIL,MOTRIN) 200 MG tablet Take 800 mg by mouth every 6 (six) hours as needed for pain.    . Linaclotide (LINZESS) 290 MCG CAPS capsule Take 1 capsule (290 mcg total) by mouth daily before breakfast. 30 capsule 4  . naproxen (NAPROSYN) 500 MG tablet Take 1 tablet (500 mg total) by mouth 2 (two) times daily. 30 tablet 0  . Vitamin D, Ergocalciferol, (DRISDOL) 50000 UNITS CAPS Take  50,000 Units by mouth every 7 (seven) days. saturday    . Lidocaine, Anorectal, (RECTICARE) 5 % CREA Apply rectally 4 times a day as needed. 1 Tube 0   No current facility-administered medications for this visit.   Allergies  Allergen Reactions  . Codeine   . Darvocet [Propoxyphene N-Acetaminophen] Anaphylaxis  . Demerol [Meperidine]   . Dilaudid [Hydromorphone Hcl]   . Hydrocodone   . Iohexol     Anaphylaxis even with prednisone prep  . Lortab [Hydrocodone-Acetaminophen] Anaphylaxis  . Morphine And Related Anaphylaxis  . Peanuts [Peanut Oil]   . Percocet [Oxycodone-Acetaminophen]   . Aspartame And Phenylalanine   . Eggs Or Egg-Derived Products Other (See Comments)    Headache from egg yolks  . Erythromycin Rash  . Penicillins Rash      Review of Systems: Gen: Denies any fever,  chills, sweats, anorexia, fatigue, weakness, malaise, weight loss, and sleep disorder CV: Denies chest pain, angina, palpitations, syncope, orthopnea, PND, peripheral edema, and claudication. Resp: Denies dyspnea at rest, dyspnea with exercise, cough, sputum, wheezing, coughing up blood, and pleurisy. GI: Denies vomiting blood, jaundice, and fecal incontinence.   Denies dysphagia or odynophagia. GU : Denies urinary burning, blood in urine, urinary frequency, urinary hesitancy, nocturnal urination, and urinary incontinence. MS: Denies joint pain, limitation of movement, and swelling, stiffness, low back pain, extremity pain. Denies muscle weakness, cramps, atrophy.  Derm: Denies rash, itching, dry skin, hives, moles, warts, or unhealing ulcers.  Psych: Denies depression, anxiety, memory loss, suicidal ideation, hallucinations, paranoia, and confusion. Heme: Denies bruising, bleeding, and enlarged lymph nodes. Neuro:  Denies any headaches, dizziness, paresthesia Endo:  Denies any problems with DM, thyroid, adrenal    Physical Exam: General: Pleasant, well developed female in no acute distress Head: Normocephalic and atraumatic Eyes:  sclerae anicteric, conjunctiva pink  Ears: Normal auditory acuity Lungs: Clear throughout to auscultation Heart: Regular rate and rhythm Abdomen: Soft, non distended, non-tender. No masses, no hepatomegaly. Normal bowel sounds Rectal: Thrombosed hemorrhoid, very tender. Brown stool heme negative. Musculoskeletal: Symmetrical with no gross deformities  Extremities: No edema  Neurological: Alert oriented x 4, grossly nonfocal Psychological:  Alert and cooperative. Normal mood and affect  Assessment and Recommendations:  49 year old female with a 2 week history of a thrombosed hemorrhoid for which she has been using Anusol with no relief. Patient reports she is also been using sitz baths with no relief. Patient has been reviewed with Dr. Carlean Purl and is to be  referred to [facility] surgery for evacuation of thrombosed hemorrhoid. In the meantime she will use recticare cream 5% 4 times a day as needed for discomfort.       Shyne Resch, Vita Barley PA-C 02/14/2015,  CC: Dr. Marcello Moores

## 2015-02-14 NOTE — Patient Instructions (Addendum)
We have sent medications to your pharmacy for you to pick up at your convenience.  You have been scheduled for an appointment with [facility] 02/15/2015 at 3:00 pm with Dr. Marcello Moores. Please arrive at 2:45pm.

## 2015-02-20 NOTE — Progress Notes (Signed)
Agree w/ Ms. Hvozdovic's note and mangement.  

## 2016-07-29 HISTORY — PX: BREAST IMPLANT EXCHANGE: SHX6296

## 2018-05-25 ENCOUNTER — Encounter: Payer: Self-pay | Admitting: Internal Medicine

## 2020-02-02 ENCOUNTER — Telehealth: Payer: Self-pay

## 2020-02-02 NOTE — Telephone Encounter (Signed)
Left message for patient to call back  

## 2020-02-04 NOTE — Telephone Encounter (Signed)
Left message for patient to call back  

## 2020-02-08 NOTE — Telephone Encounter (Signed)
Patient calling to schedule her recall colon.  She has been scheduled for COVID screen, pre-visit, and colon

## 2020-02-11 ENCOUNTER — Encounter: Payer: Self-pay | Admitting: Internal Medicine

## 2020-02-11 ENCOUNTER — Other Ambulatory Visit: Payer: Self-pay

## 2020-02-11 ENCOUNTER — Ambulatory Visit (AMBULATORY_SURGERY_CENTER): Payer: BC Managed Care – PPO | Admitting: *Deleted

## 2020-02-11 VITALS — Ht 64.5 in | Wt 165.0 lb

## 2020-02-11 DIAGNOSIS — Z01818 Encounter for other preprocedural examination: Secondary | ICD-10-CM

## 2020-02-11 DIAGNOSIS — Z86010 Personal history of colonic polyps: Secondary | ICD-10-CM

## 2020-02-11 MED ORDER — SUTAB 1479-225-188 MG PO TABS: 1.0000 | ORAL_TABLET | ORAL | 0 refills | Status: DC

## 2020-02-11 NOTE — Progress Notes (Signed)
Patient's pre-visit was done today over the phone with the patient due to COVID-19 pandemic. Name,DOB and address verified. Insurance verified. Packet of Prep instructions on 3td floor for patient to pick up including copy of a consent form . Patient understands to call us back with any questions or concerns.  Pt is aware that care partner will wait in the car during procedure; if they feel like they will be too hot or cold to wait in the car; they may wait in the 4 th floor lobby. Patient is aware to bring only one care partner. We want them to wear a mask (we do not have any that we can provide them), practice social distancing, and we will check their temperatures when they get here.  I did remind the patient that their care partner needs to stay in the parking lot the entire time and have a cell phone available, we will call them when the pt is ready for discharge. Patient will wear mask into building. Patient denies any allergies to eggs or soy. Patient denies any problems with anesthesia/sedation. Patient denies any oxygen use at home. Patient denies taking any diet/weight loss medications or blood thinners. Patient is not being treated for MRSA or C-diff. Patient is aware of our care-partner policy and [id number]-74 safety protocol. COVID-19 screening test is on 02/16/2020, the pt is aware.   Prep Prescription coupon given to the patient.

## 2020-02-16 ENCOUNTER — Ambulatory Visit (INDEPENDENT_AMBULATORY_CARE_PROVIDER_SITE_OTHER): Payer: BC Managed Care – PPO

## 2020-02-16 DIAGNOSIS — Z1159 Encounter for screening for other viral diseases: Secondary | ICD-10-CM

## 2020-02-17 LAB — SARS CORONAVIRUS 2 (TAT 6-24 HRS): SARS Coronavirus 2: NEGATIVE

## 2020-02-18 ENCOUNTER — Encounter: Payer: Self-pay | Admitting: Internal Medicine

## 2020-02-18 ENCOUNTER — Other Ambulatory Visit: Payer: Self-pay

## 2020-02-18 ENCOUNTER — Ambulatory Visit (AMBULATORY_SURGERY_CENTER): Payer: BC Managed Care – PPO | Admitting: Internal Medicine

## 2020-02-18 VITALS — BP 124/66 | HR 70 | Temp 97.7°F | Resp 12 | Ht 64.5 in | Wt 165.0 lb

## 2020-02-18 DIAGNOSIS — Z1211 Encounter for screening for malignant neoplasm of colon: Secondary | ICD-10-CM

## 2020-02-18 DIAGNOSIS — Z800 Family history of malignant neoplasm of digestive organs: Secondary | ICD-10-CM

## 2020-02-18 MED ORDER — SODIUM CHLORIDE 0.9 % IV SOLN: 500.0000 mL | Freq: Once | INTRAVENOUS | Status: DC

## 2020-02-18 MED ORDER — ESOMEPRAZOLE MAGNESIUM 40 MG PO CPDR: 40.0000 mg | DELAYED_RELEASE_CAPSULE | Freq: Every day | ORAL | 3 refills | Status: AC

## 2020-02-18 NOTE — Progress Notes (Signed)
Pt's states no medical or surgical changes since previsit or office visit.  [place] - vitals 

## 2020-02-18 NOTE — Progress Notes (Signed)
PT taken to PACU. Monitors in place. VSS. Report given to RN. 

## 2020-02-18 NOTE — Patient Instructions (Addendum)
No polyps or cancer seen - all looks good.  Your next routine colonoscopy should be in 5 years - 2026.  I refilled Nexium for a year.  I appreciate the opportunity to care for you. Gatha Mayer, MD, FACG    YOU HAD AN ENDOSCOPIC PROCEDURE TODAY AT [facility]:   Refer to the procedure report that was given to you for any specific questions about what was found during the examination.  If the procedure report does not answer your questions, please call your gastroenterologist to clarify.  If you requested that your care partner not be given the details of your procedure findings, then the procedure report has been included in a sealed envelope for you to review at your convenience later.  YOU SHOULD EXPECT: Some feelings of bloating in the abdomen. Passage of more gas than usual.  Walking can help get rid of the air that was put into your GI tract during the procedure and reduce the bloating. If you had a lower endoscopy (such as a colonoscopy or flexible sigmoidoscopy) you may notice spotting of blood in your stool or on the toilet paper. If you underwent a bowel prep for your procedure, you may not have a normal bowel movement for a few days.  Please Note:  You might notice some irritation and congestion in your nose or some drainage.  This is from the oxygen used during your procedure.  There is no need for concern and it should clear up in a day or so.  SYMPTOMS TO REPORT IMMEDIATELY:   Following lower endoscopy (colonoscopy or flexible sigmoidoscopy):  Excessive amounts of blood in the stool  Significant tenderness or worsening of abdominal pains  Swelling of the abdomen that is new, acute  Fever of 100F or higher  For urgent or emergent issues, a gastroenterologist can be reached at any hour by calling [phone]. Do not use MyChart messaging for urgent concerns.    DIET:  We do recommend a small meal at first, but then you may proceed to your regular diet.   Drink plenty of fluids but you should avoid alcoholic beverages for 24 hours.  ACTIVITY:  You should plan to take it easy for the rest of today and you should NOT DRIVE or use heavy machinery until tomorrow (because of the sedation medicines used during the test).    FOLLOW UP: Our staff will call the number listed on your records 48-72 hours following your procedure to check on you and address any questions or concerns that you may have regarding the information given to you following your procedure. If we do not reach you, we will leave a message.  We will attempt to reach you two times.  During this call, we will ask if you have developed any symptoms of COVID 19. If you develop any symptoms (ie: fever, flu-like symptoms, shortness of breath, cough etc.) before then, please call [phone].  If you test positive for Covid 19 in the 2 weeks post procedure, please call and report this information to [place].     SIGNATURES/CONFIDENTIALITY: You and/or your care partner have signed paperwork which will be entered into your electronic medical record.  These signatures attest to the fact that that the information above on your After Visit Summary has been reviewed and is understood.  Full responsibility of the confidentiality of this discharge information lies with you and/or your care-partner.

## 2020-02-18 NOTE — Op Note (Signed)
[facility] Patient Name: Sandy Simmons Procedure Date: 02/18/2020 3:35 PM MRN: [id number] Endoscopist: Gatha Mayer , MD Age: 54 Referring MD:  Date of Birth: 1965/10/03 Gender: Female Account #: [id number] Procedure:                Colonoscopy Indications:              Colon cancer screening in patient at increased                            risk: Family history of colorectal cancer in                            multiple 2nd degree relatives Medicines:                Propofol per Anesthesia, Monitored Anesthesia Care Procedure:                Pre-Anesthesia Assessment:                           - Prior to the procedure, a History and Physical                            was performed, and patient medications and                            allergies were reviewed. The patient's tolerance of                            previous anesthesia was also reviewed. The risks                            and benefits of the procedure and the sedation                            options and risks were discussed with the patient.                            All questions were answered, and informed consent                            was obtained. Prior Anticoagulants: The patient has                            taken no previous anticoagulant or antiplatelet                            agents. ASA Grade Assessment: II - A patient with                            mild systemic disease. After reviewing the risks                            and benefits, the patient was deemed in  satisfactory condition to undergo the procedure.                           After obtaining informed consent, the colonoscope                            was passed under direct vision. Throughout the                            procedure, the patient's blood pressure, pulse, and                            oxygen saturations were monitored continuously. The                            Colonoscope  was introduced through the anus and                            advanced to the the cecum, identified by                            appendiceal orifice and ileocecal valve. The                            colonoscopy was performed without difficulty. The                            patient tolerated the procedure well. The quality                            of the bowel preparation was adequate. The                            ileocecal valve, appendiceal orifice, and rectum                            were photographed. The bowel preparation used was                            SuTab via split dose instruction. Scope In: 3:55:47 PM Scope Out: 4:16:31 PM Scope Withdrawal Time: 0 hours 16 minutes 3 seconds  Total Procedure Duration: 0 hours 20 minutes 44 seconds  Findings:                 The perianal and digital rectal examinations were                            normal.                           The entire examined colon appeared normal on direct                            and retroflexion views. Complications:            No immediate complications.  Estimated Blood Loss:     Estimated blood loss: none. Impression:               - The entire examined colon is normal on direct and                            retroflexion views.                           - No specimens collected. Recommendation:           - Patient has a contact number available for                            emergencies. The signs and symptoms of potential                            delayed complications were discussed with the                            patient. Return to normal activities tomorrow.                            Written discharge instructions were provided to the                            patient.                           - Resume previous diet.                           - Continue present medications.                           - Repeat colonoscopy in 5 years for screening                             purposes. Gatha Mayer, MD 02/18/2020 4:23:31 PM This report has been signed electronically.

## 2020-02-22 ENCOUNTER — Telehealth: Payer: Self-pay

## 2020-02-22 NOTE — Telephone Encounter (Signed)
2nd follow up call made.  NALM 

## 2020-02-22 NOTE — Telephone Encounter (Signed)
NO ANSWER, MESSAGE LEFT FOR PATIENT.
